# Patient Record
Sex: Female | Born: 1980 | Race: White | Hispanic: No | Marital: Single | Smoking: Current every day smoker
Health system: Southern US, Community
[De-identification: ages and names within clinical notes are randomized; demographics above are authoritative.]

## PROBLEM LIST (undated history)

## (undated) DIAGNOSIS — N809 Endometriosis, unspecified: Secondary | ICD-10-CM

## (undated) HISTORY — PX: UMBILICAL HERNIA REPAIR: SHX196

## (undated) HISTORY — PX: LAPAROSCOPIC ENDOMETRIOSIS FULGURATION: SUR769

---

## 2008-10-31 ENCOUNTER — Observation Stay: Payer: Self-pay | Admitting: Unknown Physician Specialty

## 2008-11-11 ENCOUNTER — Observation Stay: Payer: Self-pay

## 2008-11-16 ENCOUNTER — Inpatient Hospital Stay: Payer: Self-pay

## 2009-08-29 ENCOUNTER — Emergency Department: Payer: Self-pay | Admitting: Emergency Medicine

## 2009-12-25 ENCOUNTER — Emergency Department: Payer: Self-pay | Admitting: Emergency Medicine

## 2010-05-05 ENCOUNTER — Inpatient Hospital Stay: Payer: Self-pay

## 2010-08-16 ENCOUNTER — Inpatient Hospital Stay: Payer: Self-pay | Admitting: Unknown Physician Specialty

## 2011-02-28 ENCOUNTER — Emergency Department: Payer: Self-pay | Admitting: Emergency Medicine

## 2012-08-12 ENCOUNTER — Emergency Department: Payer: Self-pay | Admitting: Emergency Medicine

## 2012-08-12 LAB — URINALYSIS, COMPLETE
Bacteria: NONE SEEN
Bilirubin,UR: NEGATIVE
Glucose,UR: NEGATIVE mg/dL (ref 0–75)
Specific Gravity: 1.033 (ref 1.003–1.030)
Squamous Epithelial: 2
WBC UR: 3 /HPF (ref 0–5)

## 2012-08-12 LAB — COMPREHENSIVE METABOLIC PANEL
Albumin: 3.1 g/dL — ABNORMAL LOW (ref 3.4–5.0)
Alkaline Phosphatase: 53 U/L (ref 50–136)
Anion Gap: 8 (ref 7–16)
Bilirubin,Total: 0.3 mg/dL (ref 0.2–1.0)
Calcium, Total: 8.2 mg/dL — ABNORMAL LOW (ref 8.5–10.1)
Co2: 25 mmol/L (ref 21–32)
Creatinine: 0.64 mg/dL (ref 0.60–1.30)
EGFR (Non-African Amer.): >60
Glucose: 84 mg/dL (ref 65–99)
Osmolality: 275 (ref 275–301)
SGOT(AST): 30 U/L (ref 15–37)
SGPT (ALT): 22 U/L (ref 12–78)
Sodium: 138 mmol/L (ref 136–145)

## 2012-08-12 LAB — CBC
HCT: 31.2 % — ABNORMAL LOW (ref 35.0–47.0)
HGB: 10.6 g/dL — ABNORMAL LOW (ref 12.0–16.0)
MCHC: 33.9 g/dL (ref 32.0–36.0)
MCV: 88 fL (ref 80–100)
RDW: 13.5 % (ref 11.5–14.5)

## 2012-08-12 LAB — LIPASE, BLOOD: Lipase: 82 U/L (ref 73–393)

## 2012-12-29 ENCOUNTER — Inpatient Hospital Stay: Payer: Self-pay | Admitting: Obstetrics and Gynecology

## 2012-12-29 LAB — CBC WITH DIFFERENTIAL/PLATELET
Basophil #: 0.1 10*3/uL (ref 0.0–0.1)
Basophil %: 0.8 %
Eosinophil %: 1.6 %
HCT: 31.3 % — ABNORMAL LOW (ref 35.0–47.0)
HGB: 10.4 g/dL — ABNORMAL LOW (ref 12.0–16.0)
MCHC: 33.3 g/dL (ref 32.0–36.0)
MCV: 89 fL (ref 80–100)
Monocyte #: 0.7 x10 3/mm (ref 0.2–0.9)
Neutrophil #: 6.3 10*3/uL (ref 1.4–6.5)
Neutrophil %: 73.5 %
Platelet: 202 10*3/uL (ref 150–440)
RBC: 3.53 10*6/uL — ABNORMAL LOW (ref 3.80–5.20)
RDW: 13.7 % (ref 11.5–14.5)
WBC: 8.5 10*3/uL (ref 3.6–11.0)

## 2012-12-30 LAB — HEMATOCRIT: HCT: 30.9 % — ABNORMAL LOW (ref 35.0–47.0)

## 2013-01-14 ENCOUNTER — Emergency Department: Payer: Self-pay | Admitting: Internal Medicine

## 2013-04-27 ENCOUNTER — Emergency Department: Payer: Self-pay | Admitting: Emergency Medicine

## 2013-04-27 LAB — URINALYSIS, COMPLETE
Bilirubin,UR: NEGATIVE
Blood: NEGATIVE
Ketone: NEGATIVE
Nitrite: NEGATIVE
Protein: NEGATIVE
RBC,UR: 1 /HPF (ref 0–5)
Specific Gravity: 1.014 (ref 1.003–1.030)
Squamous Epithelial: 9

## 2013-04-27 LAB — COMPREHENSIVE METABOLIC PANEL
Anion Gap: 4 — ABNORMAL LOW (ref 7–16)
BUN: 7 mg/dL (ref 7–18)
Co2: 26 mmol/L (ref 21–32)
Creatinine: 0.86 mg/dL (ref 0.60–1.30)
EGFR (Non-African Amer.): >60
Potassium: 4.2 mmol/L (ref 3.5–5.1)
SGOT(AST): 39 U/L — ABNORMAL HIGH (ref 15–37)
SGPT (ALT): 31 U/L (ref 12–78)

## 2013-04-27 LAB — CBC
HCT: 37.4 % (ref 35.0–47.0)
HGB: 12.7 g/dL (ref 12.0–16.0)
MCH: 28 pg (ref 26.0–34.0)
MCHC: 34 g/dL (ref 32.0–36.0)
RBC: 4.54 10*6/uL (ref 3.80–5.20)
WBC: 7.9 10*3/uL (ref 3.6–11.0)

## 2013-04-27 LAB — LIPASE, BLOOD: Lipase: 80 U/L (ref 73–393)

## 2013-05-02 ENCOUNTER — Emergency Department: Payer: Self-pay | Admitting: Emergency Medicine

## 2013-06-19 ENCOUNTER — Ambulatory Visit: Payer: Self-pay | Admitting: Surgery

## 2014-04-23 ENCOUNTER — Ambulatory Visit: Payer: Self-pay | Admitting: Certified Nurse Midwife

## 2014-06-26 ENCOUNTER — Emergency Department: Payer: Self-pay | Admitting: Emergency Medicine

## 2014-06-26 LAB — URINALYSIS, COMPLETE
Bacteria: NONE SEEN
Blood: NEGATIVE
Glucose,UR: NEGATIVE mg/dL (ref 0–75)
Leukocyte Esterase: NEGATIVE
NITRITE: NEGATIVE
PH: 5 (ref 4.5–8.0)
SPECIFIC GRAVITY: 1.034 (ref 1.003–1.030)
Squamous Epithelial: 17
WBC UR: 5 /HPF (ref 0–5)

## 2014-06-26 LAB — COMPREHENSIVE METABOLIC PANEL
Albumin: 4 g/dL (ref 3.4–5.0)
Alkaline Phosphatase: 75 U/L
Anion Gap: 5 — ABNORMAL LOW (ref 7–16)
BUN: 8 mg/dL (ref 7–18)
Bilirubin,Total: 0.8 mg/dL (ref 0.2–1.0)
Calcium, Total: 8.4 mg/dL — ABNORMAL LOW (ref 8.5–10.1)
Chloride: 106 mmol/L (ref 98–107)
Co2: 26 mmol/L (ref 21–32)
Creatinine: 0.87 mg/dL (ref 0.60–1.30)
EGFR (African American): >60
EGFR (Non-African Amer.): >60
Glucose: 89 mg/dL (ref 65–99)
Osmolality: 272 (ref 275–301)
Potassium: 4 mmol/L (ref 3.5–5.1)
SGOT(AST): 25 U/L (ref 15–37)
SGPT (ALT): 17 U/L (ref 12–78)
Sodium: 137 mmol/L (ref 136–145)
TOTAL PROTEIN: 7.6 g/dL (ref 6.4–8.2)

## 2014-06-26 LAB — CBC WITH DIFFERENTIAL/PLATELET
BASOS ABS: 0 10*3/uL (ref 0.0–0.1)
Basophil %: 0.5 %
EOS ABS: 0.1 10*3/uL (ref 0.0–0.7)
EOS PCT: 1.7 %
HCT: 36.5 % (ref 35.0–47.0)
HGB: 12.3 g/dL (ref 12.0–16.0)
LYMPHS ABS: 1.3 10*3/uL (ref 1.0–3.6)
Lymphocyte %: 17.9 %
MCH: 29.7 pg (ref 26.0–34.0)
MCHC: 33.8 g/dL (ref 32.0–36.0)
MCV: 88 fL (ref 80–100)
MONO ABS: 0.5 x10 3/mm (ref 0.2–0.9)
Monocyte %: 7 %
NEUTROS PCT: 72.9 %
Neutrophil #: 5.3 10*3/uL (ref 1.4–6.5)
PLATELETS: 157 10*3/uL (ref 150–440)
RBC: 4.15 10*6/uL (ref 3.80–5.20)
RDW: 13.6 % (ref 11.5–14.5)
WBC: 7.3 10*3/uL (ref 3.6–11.0)

## 2014-06-26 LAB — LIPASE, BLOOD: Lipase: 74 U/L (ref 73–393)

## 2015-04-14 ENCOUNTER — Emergency Department
Admit: 2015-04-14 | Disposition: A | Discharge status: Home / Self Care | Payer: Self-pay | Admitting: Emergency Medicine

## 2015-04-16 ENCOUNTER — Emergency Department
Admit: 2015-04-16 | Disposition: A | Discharge status: Home / Self Care | Payer: Self-pay | Admitting: Emergency Medicine

## 2015-04-18 NOTE — Op Note (Signed)
PATIENT NAME:  Kim Campbell, Kim Campbell MR#:  [id number] DATE OF BIRTH:  08/05/1981  DATE OF PROCEDURE:  06/19/2013  PREOPERATIVE DIAGNOSIS: Umbilical hernia.   POSTOPERATIVE DIAGNOSIS: Umbilical hernia.   PROCEDURE: Umbilical hernia repair.   SURGEON: Renda RollsWilton Smith, Campbell.D.   ANESTHESIA: General.   INDICATIONS: This 34 year old female has reported bulging at the navel with associated pain. She did have physical findings of an umbilical hernia, which was small in size, which appeared to be the cause of her pain and surgery was recommended for definitive treatment.   DESCRIPTION OF PROCEDURE: The patient was placed on the operating table in the supine position under general anesthesia. The abdomen was prepared with ChloraPrep and draped in a sterile manner. A transversely oriented infraumbilical curvilinear incision was made approximately 2.5 cm in length, carried down through subcutaneous tissues to encounter herniated properitoneal fatty tissue, which was dissected free from surrounding structures. The approximate size of this mass of herniated tissue was approximately 2 x 2 x 2 cm in dimension. This was dissected free from the fascial ring defect and was reduced.   Next, the fascial ring defect was further delineated, held with Kocher clamps. The atrial mesh was cut to create a circular shape of approximately 1.5 cm in diameter and this was placed into the properitoneal plane. It was sutured to the fascia with 0 Surgilon above and below the defect.   Next, the repair was carried out with a transversely oriented suture line of interrupted 0 Surgilon figure-of-eight sutures incorporating mesh into each suture. The repair looked good. Hemostasis was intact. It is noted that a number of small bleeding points were cauterized during the course of the procedure. The subcutaneous tissues were closed with a 5-0 Monocryl pursestring suture.   Next, the skin was closed with running 5-0 Monocryl subcuticular suture and  Dermabond. The patient tolerated surgery satisfactorily and was then prepared for transfer to the recovery room.   ____________________________ Shela CommonsJ. Renda RollsWilton Smith, MD jws:aw D: 06/19/2013 12:14:21 ET T: 06/19/2013 12:45:38 ET JOB#: [id number]  cc: Adella HareJ. Wilton Smith, MD, <Dictator> Adella HareWILTON J SMITH MD ELECTRONICALLY SIGNED 06/20/2013 18:13

## 2015-05-13 ENCOUNTER — Encounter: Payer: Self-pay | Admitting: Emergency Medicine

## 2015-05-13 DIAGNOSIS — M779 Enthesopathy, unspecified: Secondary | ICD-10-CM | POA: Insufficient documentation

## 2015-05-13 DIAGNOSIS — Z720 Tobacco use: Secondary | ICD-10-CM | POA: Insufficient documentation

## 2015-05-13 NOTE — ED Notes (Signed)
Pt presents to ED with c/o right wrist pain. Seen in this ED approx 1 month ago for the same with negative xrays. Pt states her pain has increased and she is unable to bend her thumb back and pain increases with movement. Pt states she thinks she may have torn a muscle and would like it re-evaluated. Pt currently wearing a wrist brace.

## 2015-05-14 ENCOUNTER — Emergency Department
Admission: EM | Admit: 2015-05-14 | Discharge: 2015-05-14 | Disposition: A | Discharge status: Home / Self Care | Payer: Self-pay | Attending: Emergency Medicine | Admitting: Emergency Medicine

## 2015-05-14 DIAGNOSIS — M778 Other enthesopathies, not elsewhere classified: Secondary | ICD-10-CM

## 2015-05-14 HISTORY — DX: Endometriosis, unspecified: N80.9

## 2015-05-14 MED ORDER — KETOROLAC TROMETHAMINE 10 MG PO TABS
10.0000 mg | ORAL_TABLET | Freq: Once | ORAL | Status: AC
  Administered 2015-05-14: 10 mg via ORAL

## 2015-05-14 MED ORDER — KETOROLAC TROMETHAMINE 10 MG PO TABS
ORAL_TABLET | ORAL | Status: AC
  Administered 2015-05-14: 10 mg via ORAL
  Filled 2015-05-14: qty 1

## 2015-05-14 MED ORDER — IBUPROFEN 800 MG PO TABS: 800.0000 mg | ORAL_TABLET | Freq: Three times a day (TID) | ORAL | Status: DC | PRN

## 2015-05-14 NOTE — ED Provider Notes (Signed)
[facility] Emergency Department Provider Note  ____________________________________________  Time seen: 3:20 AM  I have reviewed the triage vital signs and the nursing notes.   HISTORY  Chief Complaint Wrist Pain      HPI Kim Campbell BetterM Messman is a 34 y.o. female presents with right wrist pain nontraumatic 1 month. Patient admits to repetitive movement at work with the right wrist was seen in the emergency department prior for the same with negative x-ray. No fever no chills no rash.     Past Medical History  Diagnosis Date  . Endometriosis     There are no active problems to display for this patient.   Past Surgical History  Procedure Laterality Date  . Laparoscopic endometriosis fulguration    . Umbilical hernia repair      Current Outpatient Rx  Name  Route  Sig  Dispense  Refill  . ibuprofen (ADVIL,MOTRIN) 800 MG tablet   Oral   Take 1 tablet (800 mg total) by mouth every 8 (eight) hours as needed.   30 tablet   0     Allergies Review of patient's allergies indicates no known allergies.  No family history on file.  Social History History  Substance Use Topics  . Smoking status: Current Every Day Smoker -- 0.50 packs/day    Types: Cigarettes  . Smokeless tobacco: Not on file  . Alcohol Use: No    Review of Systems  Constitutional: Negative for fever. Eyes: Negative for visual changes. ENT: Negative for sore throat. Cardiovascular: Negative for chest pain. Respiratory: Negative for shortness of breath. Gastrointestinal: Negative for abdominal pain, vomiting and diarrhea. Genitourinary: Negative for dysuria. Musculoskeletal: Right wrist pain with movement Skin: Negative for rash. Neurological: Negative for headaches, focal weakness or numbness.   10-point ROS otherwise negative.  ____________________________________________   PHYSICAL EXAM:  VITAL SIGNS: ED Triage Vitals  Enc Vitals Group     BP 05/13/15 2209  116/70 mmHg     Pulse Rate 05/13/15 2209 74     Resp 05/13/15 2209 18     Temp 05/13/15 2209 98.1 F (36.7 C)     Temp Source 05/13/15 2209 Oral     SpO2 05/13/15 2209 100 %     Weight 05/13/15 2209 180 lb (81.647 kg)     Height 05/13/15 2209 5\' 11"  (1.803 m)     Head Cir --      Peak Flow --      Pain Score 05/13/15 2211 0     Pain Loc --      Pain Edu? --      Excl. in GC? --      Constitutional: Alert and oriented. Well appearing and in no distress. Eyes: Conjunctivae are normal. PERRL. Normal extraocular movements. ENT   Head: Normocephalic and atraumatic.   Nose: No congestion/rhinnorhea.   Mouth/Throat: Mucous membranes are moist.   Neck: No stridor. Cardiovascular: Normal rate, regular rhythm. Normal and symmetric distal pulses are present in all extremities. No murmurs, rubs, or gallops. Respiratory: Normal respiratory effort without tachypnea nor retractions. Breath sounds are clear and equal bilaterally. No wheezes/rales/rhonchi. Gastrointestinal: Soft and nontender. No distention. There is no CVA tenderness. Genitourinary: deferred Musculoskeletal: Right wrist pain with movement. Gross deformity no overlying erythema No joint effusions.  No lower extremity tenderness nor edema. Neurologic:  Normal speech and language. No gross focal neurologic deficits are appreciated. Speech is normal.  Skin:  Skin is warm, dry and intact. No rash noted. Psychiatric: Mood  and affect are normal. Speech and behavior are normal. Patient exhibits appropriate insight and judgment.  ____________________________________________        INITIAL IMPRESSION / ASSESSMENT AND PLAN / ED COURSE  Pertinent labs & imaging results that were available during my care of the patient were reviewed by me and considered in my medical decision making (see chart for details).  History of physical exam consistent with right wrist tendinitis as such brace applied anti-inflammatory  given  ____________________________________________   FINAL CLINICAL IMPRESSION(S) / ED DIAGNOSES  Final diagnoses:  Tendonitis of wrist, right      Darci Currentandolph N Jasani Lengel, MD 05/14/15 [phone]

## 2015-05-14 NOTE — Discharge Instructions (Signed)

## 2015-05-14 NOTE — ED Notes (Signed)
Pt to ED c/o right wrist pain x3 months.  Pt states pain to right lateral wrist 10/10 with movement of thumb or rotation of wrist.  Mild swelling noted to area.  No obvious deformities noted, cap refill <3 seconds, pt denies numbness/tingling.  Pt works at [employer] with a lot of hand movement and rotation.  Pt is A&Ox4, speaking in compete and coherent sentences and in NAD at this time.

## 2015-06-22 ENCOUNTER — Encounter: Payer: Self-pay | Admitting: Emergency Medicine

## 2015-06-22 ENCOUNTER — Emergency Department
Admission: EM | Admit: 2015-06-22 | Discharge: 2015-06-22 | Disposition: A | Discharge status: Home / Self Care | Payer: Worker's Compensation | Attending: Emergency Medicine | Admitting: Emergency Medicine

## 2015-06-22 DIAGNOSIS — Z720 Tobacco use: Secondary | ICD-10-CM | POA: Diagnosis not present

## 2015-06-22 DIAGNOSIS — M25531 Pain in right wrist: Secondary | ICD-10-CM | POA: Diagnosis present

## 2015-06-22 DIAGNOSIS — Z87828 Personal history of other (healed) physical injury and trauma: Secondary | ICD-10-CM | POA: Diagnosis not present

## 2015-06-22 DIAGNOSIS — M778 Other enthesopathies, not elsewhere classified: Secondary | ICD-10-CM

## 2015-06-22 MED ORDER — OXYCODONE-ACETAMINOPHEN 5-325 MG PO TABS
ORAL_TABLET | ORAL | Status: AC
  Administered 2015-06-22: 1 via ORAL
  Filled 2015-06-22: qty 1

## 2015-06-22 MED ORDER — IBUPROFEN 800 MG PO TABS
ORAL_TABLET | ORAL | Status: AC
  Administered 2015-06-22: 800 mg via ORAL
  Filled 2015-06-22: qty 1

## 2015-06-22 MED ORDER — IBUPROFEN 800 MG PO TABS: 800.0000 mg | ORAL_TABLET | Freq: Three times a day (TID) | ORAL | Status: DC | PRN

## 2015-06-22 MED ORDER — IBUPROFEN 800 MG PO TABS
800.0000 mg | ORAL_TABLET | Freq: Once | ORAL | Status: AC
  Administered 2015-06-22: 800 mg via ORAL

## 2015-06-22 MED ORDER — OXYCODONE-ACETAMINOPHEN 5-325 MG PO TABS
1.0000 | ORAL_TABLET | Freq: Once | ORAL | Status: AC
  Administered 2015-06-22: 1 via ORAL

## 2015-06-22 MED ORDER — OXYCODONE-ACETAMINOPHEN 5-325 MG PO TABS: 1.0000 | ORAL_TABLET | ORAL | Status: DC | PRN

## 2015-06-22 NOTE — ED Provider Notes (Signed)
[facility] Emergency Department Provider Note  ____________________________________________  Time seen: Approximately 4:11 AM  I have reviewed the triage vital signs and the nursing notes.   HISTORY  Chief Complaint Wrist Pain    HPI Kim Campbell is a 34 y.o. female who presents with right wrist pain. States she injured her wrist at work in [employer] approximately 5 months ago with negative x-rays. She was told to rest her wrist for one week. She has had intermittent pain in the same wrist associated with lifting baskets of how she felt be swelling at work. This evening while at work patient lifted an extra heavy basket fully loaded with hush puppies and had severe pain with swelling in her wrist. Patient is right-hand dominant. Patient denies striking wrist or fallonto outstretched hand.   Past Medical History  Diagnosis Date  . Endometriosis     There are no active problems to display for this patient.   Past Surgical History  Procedure Laterality Date  . Laparoscopic endometriosis fulguration    . Umbilical hernia repair      Current Outpatient Rx  Name  Route  Sig  Dispense  Refill  . ibuprofen (ADVIL,MOTRIN) 800 MG tablet   Oral   Take 1 tablet (800 mg total) by mouth every 8 (eight) hours as needed.   30 tablet   0     Allergies Review of patient's allergies indicates no known allergies.  No family history on file.  Social History History  Substance Use Topics  . Smoking status: Current Every Day Smoker -- 0.50 packs/day for 8 years    Types: Cigarettes  . Smokeless tobacco: Not on file  . Alcohol Use: No    Review of Systems Constitutional: No fever/chills Eyes: No visual changes. ENT: No sore throat. Cardiovascular: Denies chest pain. Respiratory: Denies shortness of breath. Gastrointestinal: No abdominal pain.  No nausea, no vomiting.  No diarrhea.  No constipation. Genitourinary: Negative for  dysuria. Musculoskeletal: Positive for right wrist pain. Negative for back pain. Skin: Negative for rash. Neurological: Negative for headaches, focal weakness or numbness.  10-point ROS otherwise negative.  ____________________________________________   PHYSICAL EXAM:  VITAL SIGNS: ED Triage Vitals  Enc Vitals Group     BP 06/22/15 0030 133/77 mmHg     Pulse Rate 06/22/15 0030 87     Resp 06/22/15 0030 18     Temp 06/22/15 0030 98.7 F (37.1 C)     Temp Source 06/22/15 0030 Oral     SpO2 06/22/15 0030 97 %     Weight 06/22/15 0030 180 lb (81.647 kg)     Height 06/22/15 0030 5\' 11"  (1.803 m)     Head Cir --      Peak Flow --      Pain Score 06/22/15 0037 0     Pain Loc --      Pain Edu? --      Excl. in GC? --     Constitutional: Alert and oriented. Well appearing and in no acute distress. Eyes: Conjunctivae are normal. PERRL. EOMI. Head: Atraumatic. Nose: No congestion/rhinnorhea. Mouth/Throat: Mucous membranes are moist.  Oropharynx non-erythematous. Neck: No stridor.   Cardiovascular: Normal rate, regular rhythm. Grossly normal heart sounds.  Good peripheral circulation. Respiratory: Normal respiratory effort.  No retractions. Lungs CTAB. Gastrointestinal: Soft and nontender. No distention. No abdominal bruits. No CVA tenderness. Musculoskeletal: Right medial wrist mildly tender to palpation with small area of swelling. Painful to lift her right  thumb without weakness, numbness or tingling. 2+ radial pulse. Brisk less than 5 second cap refill. Negative Tinel's sign. No lower extremity tenderness nor edema.  No joint effusions. Neurologic:  Normal speech and language. No gross focal neurologic deficits are appreciated. Speech is normal. No gait instability. Skin:  Skin is warm, dry and intact. No rash noted. Psychiatric: Mood and affect are normal. Speech and behavior are normal.  ____________________________________________   LABS (all labs ordered are listed, but  only abnormal results are displayed)  Labs Reviewed - No data to display ____________________________________________  EKG  None ____________________________________________  RADIOLOGY  None ____________________________________________   PROCEDURES  Procedure(s) performed: None  Critical Care performed: No  ____________________________________________   INITIAL IMPRESSION / ASSESSMENT AND PLAN / ED COURSE  Pertinent labs & imaging results that were available during my care of the patient were reviewed by me and considered in my medical decision making (see chart for details).  34 year old female who presents with reactivation of a prior right wrist injury. Clinical exam consistent with tendinitis. Will treat with a combination of NSAIDs and analgesics; Velcro wrist splint and follow-up with orthopedics. Strict return precautions given. Patient verbalizes understanding and agrees with plan of care. ____________________________________________   FINAL CLINICAL IMPRESSION(S) / ED DIAGNOSES  Final diagnoses:  Right wrist tendinitis      Irean Hong, MD 06/22/15 [phone]

## 2015-06-22 NOTE — Discharge Instructions (Signed)
1. Take pain medicines as needed (Motrin/Percocet #15). 2. You may remove Velcro wrist splint for bathing and sleeping. 3. Return to the ER for worsening symptoms, increased swelling, numbness/tingling/weakness or other concerns.  Tendinitis Tendinitis is swelling and inflammation of the tendons. Tendons are band-like tissues that connect muscle to bone. Tendinitis commonly occurs in the:   Shoulders (rotator cuff).  Heels (Achilles tendon).  Elbows (triceps tendon). CAUSES Tendinitis is usually caused by overusing the tendon, muscles, and joints involved. When the tissue surrounding a tendon (synovium) becomes inflamed, it is called tenosynovitis. Tendinitis commonly develops in people whose jobs require repetitive motions. SYMPTOMS  Pain.  Tenderness.  Mild swelling. DIAGNOSIS Tendinitis is usually diagnosed by physical exam. Your health care provider may also order X-rays or other imaging tests. TREATMENT Your health care provider may recommend certain medicines or exercises for your treatment. HOME CARE INSTRUCTIONS   Use a sling or splint for as long as directed by your health care provider until the pain decreases.  Put ice on the injured area.  Put ice in a plastic bag.  Place a towel between your skin and the bag.  Leave the ice on for 15-20 minutes, 3-4 times a day, or as directed by your health care provider.  Avoid using the limb while the tendon is painful. Perform gentle range of motion exercises only as directed by your health care provider. Stop exercises if pain or discomfort increase, unless directed otherwise by your health care provider.  Only take over-the-counter or prescription medicines for pain, discomfort, or fever as directed by your health care provider. SEEK MEDICAL CARE IF:   Your pain and swelling increase.  You develop new, unexplained symptoms, especially increased numbness in the hands. MAKE SURE YOU:   Understand these  instructions.  Will watch your condition.  Will get help right away if you are not doing well or get worse. Document Released: 12/10/2000 Document Revised: 04/29/2014 Document Reviewed: 03/01/2011 [facility] Patient Information 2015 [place], [place]. This information is not intended to replace advice given to you by your health care provider. Make sure you discuss any questions you have with your health care provider.  Repetitive Strain Injuries Repetitive strain injuries (RSIs) result from overuse or misuse of soft tissues including muscles, tendons, or nerves. Tendons are the cord-like structures that attach muscles to bones. RSIs can affect almost any part of the body. However, RSIs are most common in the arms (thumbs, wrists, elbows, shoulders) and legs (ankles, knees). Common medical conditions that are often caused by repetitive strain include carpal tunnel syndrome, tennis or golfer's elbow, bursitis, and tendonitis. If RSIs are treated early, and therepeated activity is reduced or removed, the severity and length of your problems can usually be reduced. RSIs are also called cumulative trauma disorders (CTD).  CAUSES  Many RSIs occur due to repeating the same activity at work over weeks or months without sufficient rest, such as prolonged typing. RSIs also commonly occur when a hobby or sport is done repeatedly without sufficient rest. RSIs can also occur due to repeated strain or stress on a body part in someone who has one or more risk factors for RSIs. RISK FACTORS Workplace risk factors  Frequent computer use, especially if your workstation is not adjusted for your body type.  Infrequent rest breaks.  Working in a high-pressure environment.  Working at a [employer].  Repeating the same motion, such as frequent typing.  Working in an awkward position or holding the same position for  a long time.  Forceful movements such as lifting, pulling, or pushing.  Vibration caused by using  power tools.  Working in cold temperatures.  Job stress. Personal risk factors  Poor posture.  Being loose-jointed.  Not exercising regularly.  Being overweight.  Arthritis, diabetes, thyroid problems, or other long-term (chronic)medical conditions.  Vitamin deficiencies.  Keeping your fingernails long.  An unhealthy, stressful, or inactive lifestyle.  Not sleeping well. SYMPTOMS  Symptoms often begin at work but become more noticeable after the repeated stress has ended. For example, you may develop fatigue or soreness in your wrist while typingat work, and at night you may develop numbness and tingling in your fingers. Common symptoms include:   Burning, shooting, or aching pain, especially in the fingers, palms, wrists, forearms, or shoulders.  Tenderness.  Swelling.  Tingling, numbness, or loss of feeling.  Pain with certain activities, such as turning a doorknob or reaching above your head.  Weakness, heaviness, or loss of coordination in yourhand.  Muscle spasms or tightness. In some cases, symptoms can become so intense that it is difficult to perform everyday tasks. Symptoms that do not improve with rest may indicate a more serious condition.  DIAGNOSIS  Your caregiver may determine the type ofRSI you have based on your medical evaluation and a description of your activities.  TREATMENT  Treatment depends on the severity and type of RSI you have. Your caregiver may recommend rest for the affected body part, medicines, and physical or occupational therapy to reduce pain, swelling, and soreness. Discuss the activities you do repeatedly with your caregiver. Your caregiver can help you decide whether you need to change your activities. An RSI may take months or years to heal, especially if the affected body part gets insufficient rest. In some cases, such as severe carpal tunnel syndrome, surgery may be recommended. PREVENTION  Talk with your supervisor to make  sure you have the proper equipment for your work station.  Maintain good posture at your desk or work station with:  Feet flat on the floor.  Knees directly over the feet, bent at a right angle.  Lower back supported by your chair or a cushion in the curve of your lower back.  Shoulders and arms relaxed and at your sides.  Neck relaxed and not bent forwards or backwards.  Your desk and computer workstation properly adjusted to your body type.  Your chair adjusted so there is no excess pressure on the back of your thighs.  The keyboard resting above your thighs. You should be able to reach the keys with your elbows at your side, bent at a right angle. Your arms should be supported on forearm rests, with your forearms parallel to the ground.  The computer mouse within easy reach.  The monitor directly in front of you, so that your eyes are aligned with the top of the screen. The screen should be about 15 to 25 inches from your eyes.  While typing, keep your wrist straight, in a neutral position. Move your entire arm when you move your mouse or when typing hard-to-reach keys.  Only use your computer as much as you need to for work. Do not use it during breaks.  Take breaks often from any repeated activity. Alternate with another task which requires you to use different muscles, or rest at least once every hour.  Change positions regularly. If you spend a lot of time sitting, get up, walk around, and stretch.  Do not hold pens or  pencils tightly when writing.  Exercise regularly.  Maintain a normal weight.  Eat a diet with plenty of vegetables, whole grains, and fruit.  Get sufficient, restful sleep. HOME CARE INSTRUCTIONS  If your caregiver prescribed medicine to help reduce swelling, take it as directed.  Only take over-the-counter or prescription medicines for pain, discomfort, or fever as directed by your caregiver.  Reduce, and if needed, stopthe activities that are  causing your problems until you have no further symptoms.If your symptoms are work-related, you may need to talk to your supervisor about changing your activities.  When symptoms develop, put ice or a cold pack on the aching area.  Put ice in a plastic bag.  Place a towel between your skin and the bag.  Leave the ice on for 15-20 minutes.  If you were given a splint to keep your wrist from bending, wear it as instructed. It is important to wear the splint at night. Use the splint for as long as your caregiver recommends. SEEK MEDICAL CARE IF:  You develop new problems.  Your problems do not get better with medicine. MAKE SURE YOU:  Understand these instructions.  Will watch your condition.  Will get help right away if you are not doing well or get worse. Document Released: 12/03/2002 Document Revised: 06/13/2012 Document Reviewed: 02/03/2012 [facility] Patient Information 2015 [place], [place]. This information is not intended to replace advice given to you by your health care provider. Make sure you discuss any questions you have with your health care provider.

## 2015-06-22 NOTE — ED Notes (Signed)
MD at bedside. 

## 2015-06-22 NOTE — ED Notes (Signed)
Patient states that a couple months ago she was lifting something heavy at work and hurt her right wrist. Patient states that she was seen here at that time and was told to rest it for 7 days. Patient states that she continues to have pain to her wrist.

## 2015-11-17 IMAGING — CR DG FOOT COMPLETE 3+V*L*
1 series · 3 of 3 positions shown · non-contrast
Comparison: None.

CLINICAL DATA: Fall down stairs with fifth toe pain, initial
encounter

EXAM:
LEFT FOOT - COMPLETE 3+ VIEW

[Series 1: ap · 0.17mm/px · 3 of 3 slices shown]
[im 1/3]
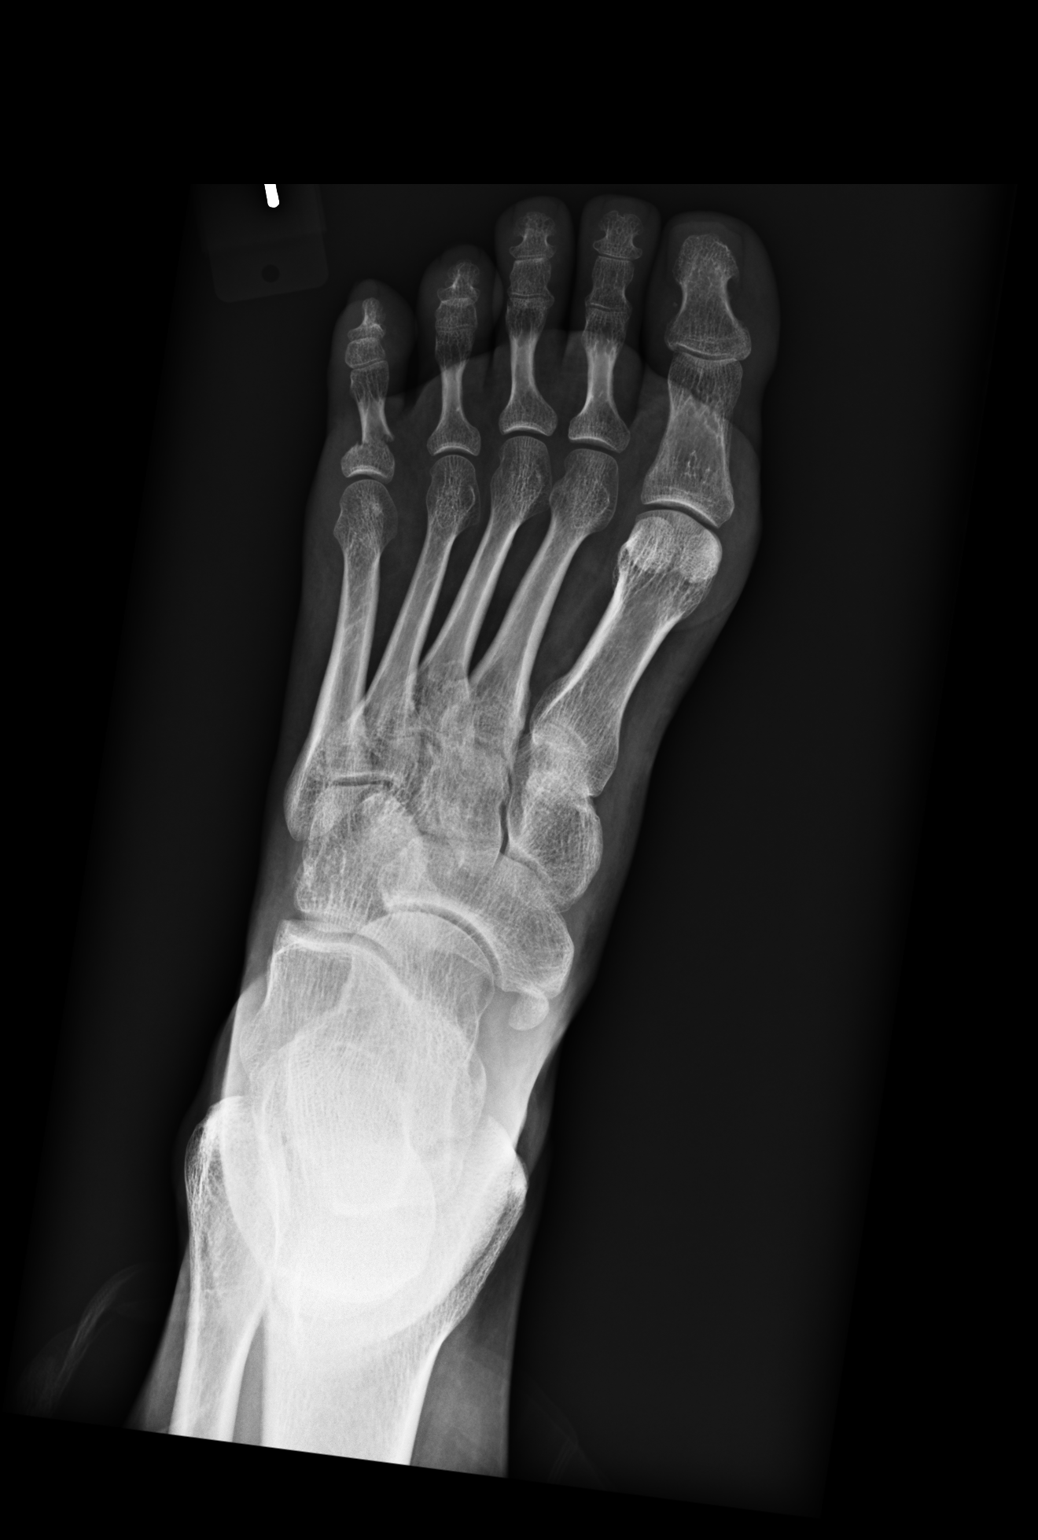
[im 2/3]
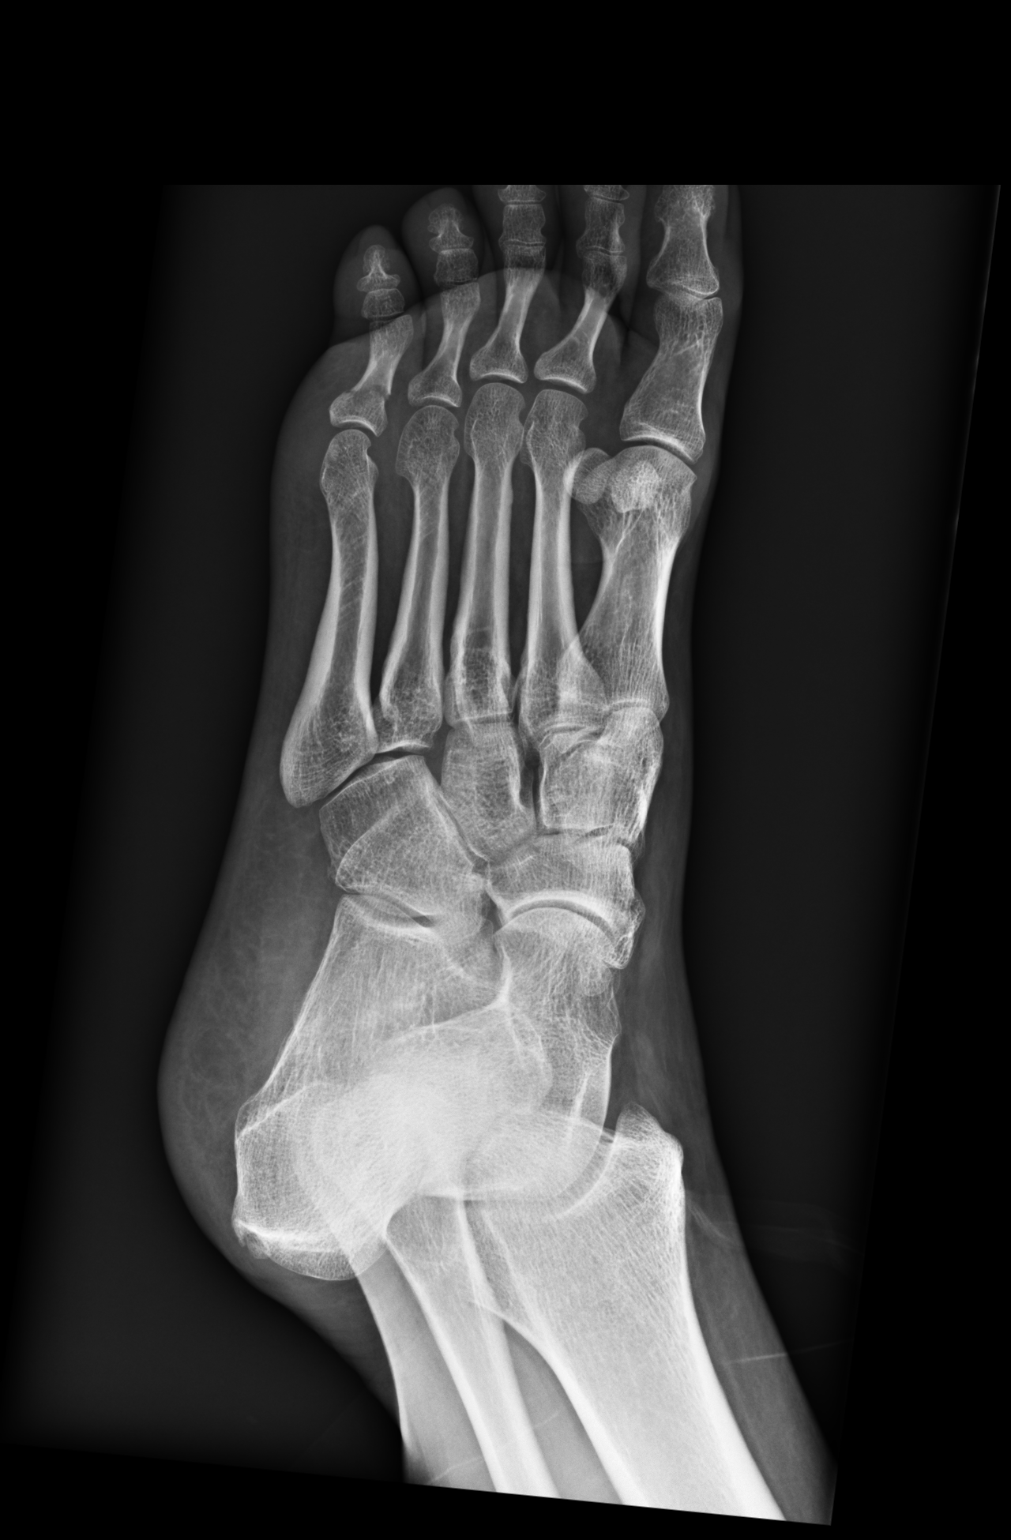
[im 3/3]
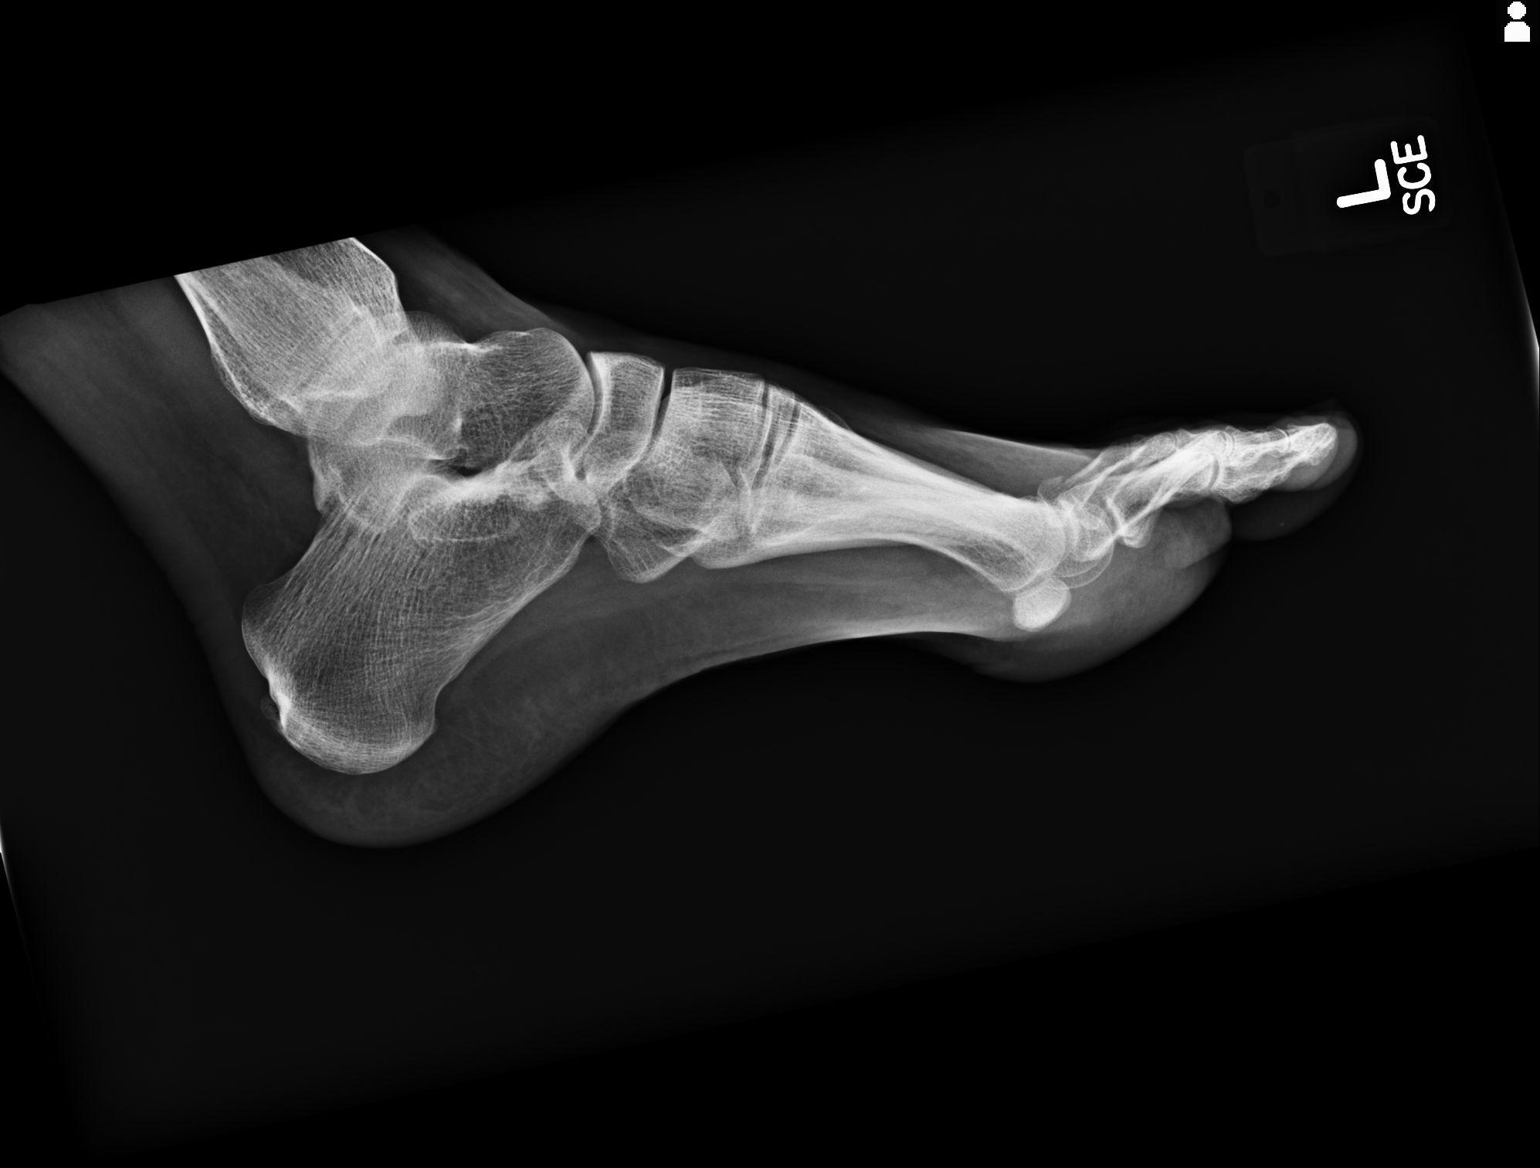

[3 of 3 positions shown; findings below may reference images not displayed]

FINDINGS: There is a transverse fracture through the proximal diaphysis of the
fifth proximal phalanx. Only minimal displacement is noted. No other
fracture is seen. No gross soft tissue abnormality is noted.
IMPRESSION: Fifth proximal phalangeal fracture.

## 2016-05-15 ENCOUNTER — Encounter: Payer: Self-pay | Admitting: Emergency Medicine

## 2016-05-15 ENCOUNTER — Emergency Department
Admission: EM | Admit: 2016-05-15 | Discharge: 2016-05-15 | Disposition: A | Discharge status: Home / Self Care | Payer: Self-pay | Attending: Emergency Medicine | Admitting: Emergency Medicine

## 2016-05-15 DIAGNOSIS — Z5321 Procedure and treatment not carried out due to patient leaving prior to being seen by health care provider: Secondary | ICD-10-CM | POA: Insufficient documentation

## 2016-05-15 DIAGNOSIS — M545 Low back pain: Secondary | ICD-10-CM | POA: Insufficient documentation

## 2016-05-15 MED ORDER — IBUPROFEN 400 MG PO TABS
ORAL_TABLET | ORAL | Status: AC
  Filled 2016-05-15: qty 1

## 2016-05-15 MED ORDER — IBUPROFEN 400 MG PO TABS
400.0000 mg | ORAL_TABLET | Freq: Once | ORAL | Status: AC | PRN
  Administered 2016-05-15: 400 mg via ORAL

## 2016-05-15 NOTE — ED Notes (Signed)
Called x3 for room, no answer. 

## 2016-05-15 NOTE — ED Notes (Signed)
Patient states that she has had lower back pain times one week. Patient states that she bent down to pick up some flowers.

## 2016-05-18 ENCOUNTER — Emergency Department
Admission: EM | Admit: 2016-05-18 | Discharge: 2016-05-18 | Disposition: A | Discharge status: Home / Self Care | Payer: BLUE CROSS/BLUE SHIELD | Attending: Emergency Medicine | Admitting: Emergency Medicine

## 2016-05-18 DIAGNOSIS — M5442 Lumbago with sciatica, left side: Secondary | ICD-10-CM | POA: Insufficient documentation

## 2016-05-18 DIAGNOSIS — M545 Low back pain: Secondary | ICD-10-CM | POA: Diagnosis present

## 2016-05-18 DIAGNOSIS — F17210 Nicotine dependence, cigarettes, uncomplicated: Secondary | ICD-10-CM | POA: Diagnosis not present

## 2016-05-18 MED ORDER — MELOXICAM 15 MG PO TABS: 15.0000 mg | ORAL_TABLET | Freq: Every day | ORAL | Status: AC

## 2016-05-18 MED ORDER — TRAMADOL HCL 50 MG PO TABS
50.0000 mg | ORAL_TABLET | Freq: Four times a day (QID) | ORAL | Status: AC | PRN
Start: 2016-05-18 — End: ?

## 2016-05-18 MED ORDER — PREDNISONE 10 MG (21) PO TBPK: ORAL_TABLET | ORAL | Status: AC

## 2016-05-18 NOTE — ED Provider Notes (Signed)
[facility] Emergency Department Provider Note ____________________________________________  Time seen: Approximately 11:10 AM  I have reviewed the triage vital signs and the nursing notes.   HISTORY  Chief Complaint Back Pain    HPI Kim Campbell is a 35 y.o. female who presents to the emergency department for evaluation of lower back pain. Pain started about a week ago after bending over to pick up some flowers. Pain is on the left lower back and radiates into the left leg toward the knee. She is taking ibuprofen with little relief.  Past Medical History  Diagnosis Date  . Endometriosis     There are no active problems to display for this patient.   Past Surgical History  Procedure Laterality Date  . Laparoscopic endometriosis fulguration    . Umbilical hernia repair      Current Outpatient Rx  Name  Route  Sig  Dispense  Refill  . meloxicam (MOBIC) 15 MG tablet   Oral   Take 1 tablet (15 mg total) by mouth daily.   30 tablet   0   . predniSONE (STERAPRED UNI-PAK 21 TAB) 10 MG (21) TBPK tablet      Take 6 tablets on day 1 Take 5 tablets on day 2 Take 4 tablets on day 3 Take 3 tablets on day 4 Take 2 tablets on day 5 Take 1 tablet on day 6   21 tablet   0   . traMADol (ULTRAM) 50 MG tablet   Oral   Take 1 tablet (50 mg total) by mouth every 6 (six) hours as needed.   12 tablet   0     Allergies Review of patient's allergies indicates no known allergies.  No family history on file.  Social History Social History  Substance Use Topics  . Smoking status: Current Every Day Smoker -- 0.50 packs/day for 8 years    Types: Cigarettes  . Smokeless tobacco: Not on file  . Alcohol Use: No    Review of Systems Constitutional: No recent illness. Cardiovascular: Denies chest pain or palpitations. Respiratory: Denies shortness of breath. Musculoskeletal: Pain in Left lower back Skin: Negative for rash, wound,  lesion. Neurological: Negative for focal weakness or numbness.  ____________________________________________   PHYSICAL EXAM:  VITAL SIGNS: ED Triage Vitals  Enc Vitals Group     BP 05/18/16 1050 108/66 mmHg     Pulse Rate 05/18/16 1050 97     Resp 05/18/16 1050 16     Temp 05/18/16 1050 98.3 F (36.8 C)     Temp Source 05/18/16 1050 Oral     SpO2 05/18/16 1050 99 %     Weight 05/18/16 1050 160 lb (72.576 kg)     Height 05/18/16 1050 6' (1.829 m)     Head Cir --      Peak Flow --      Pain Score 05/18/16 1050 8     Pain Loc --      Pain Edu? --      Excl. in GC? --     Constitutional: Alert and oriented. Well appearing and in no acute distress. Eyes: Conjunctivae are normal. EOMI. Head: Atraumatic. Neck: No stridor.  Respiratory: Normal respiratory effort.   Musculoskeletal: Straight leg raise positive on the left side and approximately 50. Antalgic gait. Neurologic:  Normal speech and language. No gross focal neurologic deficits are appreciated. Speech is normal. No gait instability. Skin:  Skin is warm, dry and intact. Atraumatic. Psychiatric: Mood and  affect are normal. Speech and behavior are normal.  ____________________________________________   LABS (all labs ordered are listed, but only abnormal results are displayed)  Labs Reviewed - No data to display ____________________________________________  RADIOLOGY   ____________________________________________   PROCEDURES  Procedure(s) performed: None   ____________________________________________   INITIAL IMPRESSION / ASSESSMENT AND PLAN / ED COURSE  Pertinent labs & imaging results that were available during my care of the patient were reviewed by me and considered in my medical decision making (see chart for details).  Patient will be prescribed prednisone taper, tramadol, and meloxicam. She was instructed to follow-up with orthopedics for symptoms that are not improving with medications. She  was instructed to return to the emergency department for symptoms that change or worsen if she is unable schedule an appointment. ____________________________________________   FINAL CLINICAL IMPRESSION(S) / ED DIAGNOSES  Final diagnoses:  Acute back pain with sciatica, left       Chinita Pester, FNP 05/18/16 1121  Jennye Moccasin, MD 05/18/16 1214

## 2016-05-18 NOTE — ED Notes (Signed)
Pt c/o left lower back pain that radiates into the left leg for the past week..Marland Kitchen

## 2016-05-18 NOTE — ED Notes (Signed)
Developed lower back pain about 1 week or so ago  States she bent to pick up flowers.  Pain is in lower back and radiates into left leg  Ambulates well to treatment room
